# Patient Record
Sex: Male | Born: 2000 | Race: White | Hispanic: Yes | Marital: Single | Smoking: Never smoker
Health system: Southern US, Community
[De-identification: ages and names within clinical notes are randomized; demographics above are authoritative.]

## PROBLEM LIST (undated history)

## (undated) DIAGNOSIS — J45909 Unspecified asthma, uncomplicated: Secondary | ICD-10-CM

---
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
## 2023-12-20 ENCOUNTER — Other Ambulatory Visit: Payer: Self-pay

## 2023-12-20 ENCOUNTER — Encounter (HOSPITAL_COMMUNITY): Payer: Self-pay

## 2023-12-20 ENCOUNTER — Emergency Department (HOSPITAL_COMMUNITY)
Admission: EM | Admit: 2023-12-20 | Discharge: 2023-12-21 | Disposition: A | Attending: Emergency Medicine | Admitting: Emergency Medicine

## 2023-12-20 DIAGNOSIS — F419 Anxiety disorder, unspecified: Secondary | ICD-10-CM

## 2023-12-20 DIAGNOSIS — J45909 Unspecified asthma, uncomplicated: Secondary | ICD-10-CM | POA: Diagnosis not present

## 2023-12-20 DIAGNOSIS — F410 Panic disorder [episodic paroxysmal anxiety] without agoraphobia: Secondary | ICD-10-CM | POA: Diagnosis present

## 2023-12-20 DIAGNOSIS — Z630 Problems in relationship with spouse or partner: Secondary | ICD-10-CM | POA: Diagnosis not present

## 2023-12-20 LAB — URINE DRUG SCREEN
Amphetamines: NEGATIVE
Barbiturates: NEGATIVE
Benzodiazepines: NEGATIVE
Cocaine: NEGATIVE
Fentanyl: NEGATIVE
Methadone Scn, Ur: NEGATIVE
Opiates: NEGATIVE
Tetrahydrocannabinol: NEGATIVE

## 2023-12-20 LAB — CBC
HCT: 40.7 % (ref 39.0–52.0)
Hemoglobin: 14 g/dL (ref 13.0–17.0)
MCH: 30 pg (ref 26.0–34.0)
MCHC: 34.4 g/dL (ref 30.0–36.0)
MCV: 87.3 fL (ref 80.0–100.0)
Platelets: 311 K/uL (ref 150–400)
RBC: 4.66 MIL/uL (ref 4.22–5.81)
RDW: 12.4 % (ref 11.5–15.5)
WBC: 6.6 K/uL (ref 4.0–10.5)
nRBC: 0 % (ref 0.0–0.2)

## 2023-12-20 LAB — COMPREHENSIVE METABOLIC PANEL WITH GFR
ALT: 15 U/L (ref 0–44)
AST: 31 U/L (ref 15–41)
Albumin: 4.6 g/dL (ref 3.5–5.0)
Alkaline Phosphatase: 68 U/L (ref 38–126)
Anion gap: 10 (ref 5–15)
BUN: 12 mg/dL (ref 6–20)
CO2: 25 mmol/L (ref 22–32)
Calcium: 9.7 mg/dL (ref 8.9–10.3)
Chloride: 102 mmol/L (ref 98–111)
Creatinine, Ser: 0.99 mg/dL (ref 0.61–1.24)
GFR, Estimated: >60 mL/min (ref >60)
Glucose, Bld: 94 mg/dL (ref 70–99)
Potassium: 3.7 mmol/L (ref 3.5–5.1)
Sodium: 137 mmol/L (ref 135–145)
Total Bilirubin: 0.6 mg/dL (ref 0.0–1.2)
Total Protein: 8.7 g/dL — ABNORMAL HIGH (ref 6.5–8.1)

## 2023-12-20 LAB — ETHANOL: Alcohol, Ethyl (B): <15 mg/dL (ref <15)

## 2023-12-20 NOTE — ED Notes (Addendum)
 Introduced myself to patient. Patient states life has been hard due to stress at work and at home. Newly married. Tonight he had a panic attack after then had an argument. Does not see a therapist or take daily medications. Patient denies SI and HI at this time. Patient was able to ambulate to the bathroom for a urine sample. Patient states when he has a panic attack he had numbness in his legs as if they fell asleep.

## 2023-12-20 NOTE — ED Provider Notes (Signed)
 [facility] EMERGENCY DEPARTMENT AT [facility] Provider Note   CSN: [id number] Arrival date & time: 12/20/23  2231     Patient presents with: Panic Attack   Garrett Alvarez is a 23 y.o. male.  {Add pertinent medical, surgical, social history, OB history to YEP:67052} The history is provided by the patient and medical records.   23 y.o. M with hx of asthma, depression, presenting to the ED with panic attack after having an argument with his wife.   Patient reports they have not been married all that long, situation has been made stressful as he works long hours and often out of state.  States usually is only able to come home for a day or so and then he has to leave again.  They do not have any children at home.  He states this has been weighing on him for quite some time.  He does not currently see a counselor or a psychiatrist, nor does he take any medications for anxiety or depression.  He states he has a hard time talking about these issues.  He admits to some suicidal thoughts but no active plan.  No attempted self-harm.  Does report incident several years ago when he tried to hurt himself.  Denies HI/AVH.  No drugs or alcohol in the past 24 hours-- drinks alcohol occasionally.  Prior to Admission medications   Medication Sig Start Date End Date Taking? Authorizing Provider  albuterol  (PROVENTIL  HFA;VENTOLIN  HFA) 108 (90 BASE) MCG/ACT inhaler Inhale 4 puffs into the lungs every 6 (six) hours as needed for wheezing or shortness of breath.     [provider]  orphenadrine  (NORFLEX ) 100 MG tablet Take 1 tablet (100 mg total) by mouth 2 (two) times daily. 07/25/20   Raford Lenis, MD    Allergies: Patient has no known allergies.    Review of Systems  Psychiatric/Behavioral:  The patient is nervous/anxious.   All other systems reviewed and are negative.   Updated Vital Signs BP 122/77 (BP Location: Right Arm)   Pulse 62   Temp 98.5 F (36.9 C) (Oral)    Resp 18   SpO2 96%   Physical Exam Vitals and nursing note reviewed.  Constitutional:      Appearance: He is well-developed.  HENT:     Head: Normocephalic and atraumatic.  Eyes:     Conjunctiva/sclera: Conjunctivae normal.     Pupils: Pupils are equal, round, and reactive to light.  Cardiovascular:     Rate and Rhythm: Normal rate and regular rhythm.     Heart sounds: Normal heart sounds.  Pulmonary:     Effort: Pulmonary effort is normal.     Breath sounds: Normal breath sounds.  Abdominal:     General: Bowel sounds are normal.     Palpations: Abdomen is soft.  Musculoskeletal:        General: Normal range of motion.     Cervical back: Normal range of motion.  Skin:    General: Skin is warm and dry.  Neurological:     Mental Status: He is alert and oriented to person, place, and time.  Psychiatric:     Comments: Depressed mood, flat affect, limited eye contact and staring at his feet throughout exam Admits to passive SI without active plan, denies HI/AVH     (all labs ordered are listed, but only abnormal results are displayed) Labs Reviewed - No data to display  EKG: None  Radiology: No results found.  {Document cardiac  monitor, telemetry assessment procedure when appropriate:32947} Procedures   Medications Ordered in the ED - No data to display    {Click here for ABCD2, HEART and other calculators REFRESH Note before signing:1}                              Medical Decision Making Amount and/or Complexity of Data Reviewed Labs: ordered.   ***  {Document critical care time when appropriate  Document review of labs and clinical decision tools ie CHADS2VASC2, etc  Document your independent review of radiology images and any outside records  Document your discussion with family members, caretakers and with consultants  Document social determinants of health affecting pt's care  Document your decision making why or why not admission, treatments were  needed:32947:::1}   Final diagnoses:  None    ED Discharge Orders     None

## 2023-12-20 NOTE — ED Triage Notes (Signed)
 EMS reports panic attack after having an argument with his wife. History of depression and SI. Denies SI at this time. Patient states he cannot move his legs. Wants to talk to a psychiatrist.

## 2023-12-21 NOTE — BH Assessment (Signed)
 Comprehensive Clinical Assessment (CCA) Note  12/21/2023 Garrett Alvarez [facility] [id number]  Disposition: Per Garrett Olp, NP, patient is recommended for discharge with outpatient follow-up.  The patient demonstrates the following risk factors for suicide: Chronic risk factors for suicide include: previous suicide attempts  . Acute risk factors for suicide include: family or marital conflict. Protective factors for this patient include: positive social support. Considering these factors, the overall suicide risk at this point appears to be low. Patient is appropriate for outpatient follow up.   Patient is a 23 year old male with a history of anxiety disorder who presents voluntarily to [facility] ED for an assessment. Patient resides in the home with mom and identifies his family as their primary support system.Patient reports isolation, crying spells, irritability, hopelessness, guilt, loss of interest to do things they enjoy, fatigue, lack of concentration, worthlessness, change in sleep, change in appetite. Patient reports history of past suicide attempts, last occurrence was June 2025.  Pt reports that he was intoxicated and had thoughts about crashing his car but did not. Patient has a hx of Substance Abuse: etoh Last use was yesterday. Patient denies NSSIB, SI, HI, AVH.  Patient identifies his primary stressors as marital concerns and work. Patient denies history of abuse or trauma. Patient denies current legal problems. Patient is not receiving outpatient therapy and psychiatry services. Patient is not prescribed any psychotropic medications. Patient denies previous inpatient admission.  Patient denies access to weapons.  During evaluation pt is in no acute distress. He is alert, oriented x 4, calm, cooperative and attentive. his mood is appropriate with congruent affect. He has normal speech, and behavior.  Objectively there is no evidence of psychosis/mania or delusional thinking.   Patient is able to converse coherently, goal directed thoughts, no distractibility, or pre-occupation.   He also denies suicidal/self-harm/homicidal ideation, psychosis, and paranoia.  Patient answered question appropriately.      Chief Complaint:  Chief Complaint  Patient presents with   Panic Attack   Visit Diagnosis:  Anxiety Disorder     CCA Screening, Triage and Referral (STR)  Patient Reported Information How did you hear about us ? -- ([facility] ED)  What Is the Reason for Your Visit/Call Today? Per EDP: The history is provided by the patient and medical records.     23 y.o. M with hx of asthma, depression, presenting to the ED with panic attack after having an argument with his wife.   Patient reports they have not been married all that long, situation has been made stressful as he works long hours and often out of state.  States usually is only able to come home for a day or so and then he has to leave again.  They do not have any children at home.  He states this has been weighing on him for quite some time.  He does not currently see a counselor or a psychiatrist, nor does he take any medications for anxiety or depression.  He states he has a hard time talking about these issues.  He admits to some suicidal thoughts but no active plan.  No attempted self-harm.  Does report incident several years ago when he tried to hurt himself.  Denies HI/AVH.  No drugs or alcohol in the past 24 hours-- drinks alcohol occasionally.       How Long Has This Been Causing You Problems? > than 6 months  What Do You Feel Would Help You the Most Today? Treatment for Depression or other mood  problem; Stress Management; Medication(s)   Have You Recently Had Any Thoughts About Hurting Yourself? No  Are You Planning to Commit Suicide/Harm Yourself At This time? No   Flowsheet Row ED from 12/20/2023 in [facility] Emergency Department at [facility] ED from 11/25/2020 in [facility] Emergency  Department at [facility] ED from 07/24/2020 in [facility] Department at [facility]  C-SSRS RISK CATEGORY No Risk No Risk No Risk    Have you Recently Had Thoughts About Hurting Someone Garrett Alvarez? No  Are You Planning to Harm Someone at This Time? No  Explanation: Denies HI   Have You Used Any Alcohol or Drugs in the Past 24 Hours? Yes  How Long Ago Did You Use Drugs or Alcohol? No data recorded What Did You Use and How Much? Pt reports drinking etoh (4 beers and 2 mixed drinks) yesterday   Do You Currently Have a Therapist/Psychiatrist? No  Name of Therapist/Psychiatrist:    Have You Been Recently Discharged From Any Office Practice or Programs? No  Explanation of Discharge From Practice/Program: No data recorded    CCA Screening Triage Referral Assessment Type of Contact: Tele-Assessment  Telemedicine Service Delivery: Telemedicine service delivery: This service was provided via telemedicine using a 2-way, interactive audio and video technology  Is this Initial or Reassessment? Is this Initial or Reassessment?: Initial Assessment  Date Telepsych consult ordered in [facility]:  Date Telepsych consult ordered in [facility]: 12/21/23  Time Telepsych consult ordered in [facility]:  Time Telepsych consult ordered in [facility]: 0006  Location of Assessment: [facility] ED  Provider Location: [facility] Assessment Services   Collateral Involvement: none   Does Patient Have a Automotive Engineer Guardian? No  Legal Guardian Contact Information: n/a  Copy of Legal Guardianship Form: -- (n/a)  Legal Guardian Notified of Arrival: -- (n/a)  Legal Guardian Notified of Pending Discharge: -- (n/a)  If Minor and Not Living with Parent(s), Who has Custody? n/a  Is CPS involved or ever been involved? Never  Is APS involved or ever been involved? Never   Patient Determined To Be At Risk for Harm To Self or Others Based on Review of Patient Reported Information or Presenting Complaint?  No  Method: No Plan  Availability of Means: No access or NA  Intent: Vague intent or NA  Notification Required: No need or identified person  Additional Information for Danger to Others Potential: -- (n/a)  Additional Comments for Danger to Others Potential: n/a  Are There Guns or Other Weapons in Your Home? No  Types of Guns/Weapons: Denies access  Are These Weapons Safely Secured?                            No  Who Could Verify You Are Able To Have These Secured: Denies access  Do You Have any Outstanding Charges, Pending Court Dates, Parole/Probation? Denies pending legal charges  Contacted To Inform of Risk of Harm To Self or Others: -- (n/a)    Does Patient Present under Involuntary Commitment? No    [place] of Residence: [place]   Patient Currently Receiving the Following Services: Not Receiving Services   Determination of Need: Routine (7 days)   Options For Referral: Medication Management; Outpatient Therapy     CCA Biopsychosocial Patient Reported Schizophrenia/Schizoaffective Diagnosis in Past: No   Strengths: Pt willing to seek outpatient services   Mental Health Symptoms Depression:  Change in energy/activity; Difficulty Concentrating  Duration of Depressive symptoms: Duration of Depressive Symptoms: Greater than two weeks   Mania:  None   Anxiety:   Worrying   Psychosis:  None   Duration of Psychotic symptoms:    Trauma:  None   Obsessions:  None   Compulsions:  None   Inattention:  None   Hyperactivity/Impulsivity:  None   Oppositional/Defiant Behaviors:  None   Emotional Irregularity:  Chronic feelings of emptiness; Mood lability   Other Mood/Personality Symptoms:  none    Mental Status Exam Appearance and self-care  Stature:  Average   Weight:  Average weight   Clothing:  Casual   Grooming:  Normal   Cosmetic use:  None   Posture/gait:  Normal   Motor activity:  Not Remarkable   Sensorium  Attention:   Normal   Concentration:  Normal   Orientation:  X5   Recall/memory:  Normal   Affect and Mood  Affect:  Depressed; Anxious   Mood:  Euthymic   Relating  Eye contact:  Normal   Facial expression:  Sad   Attitude toward examiner:  Cooperative   Thought and Language  Speech flow: Normal   Thought content:  Appropriate to Mood and Circumstances   Preoccupation:  None   Hallucinations:  None   Organization:  Coherent   [employer] of Knowledge:  Average   Intelligence:  Average   Abstraction:  Normal   Judgement:  Normal   Reality Testing:  Adequate   Insight:  Good   Decision Making:  Normal   Social Functioning  Social Maturity:  Isolates   Social Judgement:  Normal   Stress  Stressors:  Family conflict; Relationship; Work   Coping Ability:  Normal   Skill Deficits:  Communication; Self-care   Supports:  Family     Religion: Religion/Spirituality Are You A Religious Person?: No How Might This Affect Treatment?: n/a  Leisure/Recreation:    Exercise/Diet: Exercise/Diet Do You Exercise?: No Have You Gained or Lost A Significant Amount of Weight in the Past Six Months?: No Do You Follow a Special Diet?: No Do You Have Any Trouble Sleeping?: No   CCA Employment/Education Employment/Work Situation: Employment / Work Situation Employment Situation: Employed Work Stressors: long hours Patient's Job has Been Impacted by Current Illness: No Has Patient ever Been in Equities Trader?: No  Education: Education Is Patient Currently Attending School?: No Last Grade Completed: 12 Did You Product Manager?: No Did You Have An Individualized Education Program (IIEP): No Did You Have Any Difficulty At [employer]?: No Patient's Education Has Been Impacted by Current Illness: No   CCA Family/Childhood History Family and Relationship History: Family history Marital status: Married Number of Years Married: 0 (married for 4 months) What  types of issues is patient dealing with in the relationship?: long distance relationship Additional relationship information: n/a Does patient have children?: No  Childhood History:  Childhood History By whom was/is the patient raised?: Both parents Did patient suffer any verbal/emotional/physical/sexual abuse as a child?: No Did patient suffer from severe childhood neglect?: No Has patient ever been sexually abused/assaulted/raped as an adolescent or adult?: No Was the patient ever a victim of a crime or a disaster?: No Witnessed domestic violence?: No Has patient been affected by domestic violence as an adult?: No       CCA Substance Use Alcohol/Drug Use: Alcohol / Drug Use Pain Medications: See MAR Prescriptions: See MAR Over the Counter: See MAR History of alcohol / drug use?:  (Current  use) Longest period of sobriety (when/how long): Pt reports drinking etoh once or twice a month. Yesterday pt had 4 beers and 2 mixed drinks Negative Consequences of Use:  (n/a) Withdrawal Symptoms:  (n/a)                         ASAM's:  Six Dimensions of Multidimensional Assessment  Dimension 1:  Acute Intoxication and/or Withdrawal Potential:      Dimension 2:  Biomedical Conditions and Complications:      Dimension 3:  Emotional, Behavioral, or Cognitive Conditions and Complications:     Dimension 4:  Readiness to Change:     Dimension 5:  Relapse, Continued use, or Continued Problem Potential:     Dimension 6:  Recovery/Living Environment:     ASAM Severity Score:    ASAM Recommended Level of Treatment: ASAM Recommended Level of Treatment:  (n/a)   Substance use Disorder (SUD) Substance Use Disorder (SUD)  Checklist Symptoms of Substance Use:  (n/a)  Recommendations for Services/Supports/Treatments: Recommendations for Services/Supports/Treatments Recommendations For Services/Supports/Treatments: Individual Therapy, Medication Management  Disposition  Recommendation per psychiatric provider: There are no psychiatric contraindications to discharge at this time   DSM5 Diagnoses: There are no active problems to display for this patient.    Referrals to Alternative Service(s): Referred to Alternative Service(s):   Place:   Date:   Time:    Referred to Alternative Service(s):   Place:   Date:   Time:    Referred to Alternative Service(s):   Place:   Date:   Time:    Referred to Alternative Service(s):   Place:   Date:   Time:     Rosina PARAS, [place], [facility]

## 2023-12-21 NOTE — Discharge Instructions (Addendum)
 Psychiatry recommends to follow-up with behavioral health urgent care. Encouraged to arrive between 7:00-7:15 am to get set up for outpatient services.

## 2023-12-21 NOTE — ED Notes (Signed)
 TTS evaluation in process. Father in room during evaluation.

## 2023-12-21 NOTE — ED Notes (Signed)
 Personal belongings: 1 pair of shoes, socks, sweatshirt, jeans, belt, tshirt. Dad took phone and wallet.
